# Patient Record
Sex: Female | Born: 1955 | Hispanic: No | Marital: Married | Smoking: Former smoker
Health system: Southern US, Community
[De-identification: ages and names within clinical notes are randomized; demographics above are authoritative.]

## PROBLEM LIST (undated history)

## (undated) DIAGNOSIS — I10 Essential (primary) hypertension: Secondary | ICD-10-CM

## (undated) DIAGNOSIS — J449 Chronic obstructive pulmonary disease, unspecified: Secondary | ICD-10-CM

---

## 2000-08-01 ENCOUNTER — Other Ambulatory Visit: Admission: RE | Admit: 2000-08-01 | Discharge: 2000-08-01 | Payer: Self-pay | Admitting: Gynecology

## 2010-07-23 ENCOUNTER — Encounter (INDEPENDENT_AMBULATORY_CARE_PROVIDER_SITE_OTHER): Payer: PRIVATE HEALTH INSURANCE

## 2010-07-23 DIAGNOSIS — R609 Edema, unspecified: Secondary | ICD-10-CM

## 2010-07-28 NOTE — Procedures (Unsigned)
DUPLEX DEEP VENOUS EXAM - LOWER EXTREMITY  INDICATION:  Edema.  HISTORY:  Edema:  Yes. Trauma/Surgery:  No. Pain:  Yes. PE:  No. Previous DVT:  No DVT, SVT in the 1980s. Anticoagulants:  No. Other:  DUPLEX EXAM:               CFV   SFV   PopV  PTV    GSV               R  L  R  L  R  L  R   L  R  L Thrombosis    o  o  o     o     o      o Spontaneous   +  +  +     +     +      + Phasic        +  +  +     +     +      + Augmentation  +  +  +     +     +      + Compressible  +  +  +     +     +      + Competent                 +     +      o  Legend:  + - yes  o - no  p - partial  D - decreased  IMPRESSION:  No evidence of deep venous thrombosis in the right lower extremity.  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  Results communicated to [facility] at Dr. Laurey Morale office on 07/23/2010.   _____________________________ V. Charlena Cross, MD  LT/MEDQ  D:  07/23/2010  T:  07/24/2010  Job:  [id number]

## 2014-03-13 ENCOUNTER — Ambulatory Visit: Payer: PRIVATE HEALTH INSURANCE | Admitting: Podiatry

## 2018-04-15 ENCOUNTER — Emergency Department (HOSPITAL_COMMUNITY): Payer: No Typology Code available for payment source

## 2018-04-15 ENCOUNTER — Encounter: Payer: Self-pay | Admitting: Emergency Medicine

## 2018-04-15 ENCOUNTER — Emergency Department (HOSPITAL_COMMUNITY)
Admission: EM | Admit: 2018-04-15 | Discharge: 2018-04-15 | Disposition: A | Discharge status: Home / Self Care | Payer: No Typology Code available for payment source | Attending: Emergency Medicine | Admitting: Emergency Medicine

## 2018-04-15 DIAGNOSIS — I10 Essential (primary) hypertension: Secondary | ICD-10-CM | POA: Diagnosis not present

## 2018-04-15 DIAGNOSIS — K5732 Diverticulitis of large intestine without perforation or abscess without bleeding: Secondary | ICD-10-CM

## 2018-04-15 DIAGNOSIS — K429 Umbilical hernia without obstruction or gangrene: Secondary | ICD-10-CM

## 2018-04-15 DIAGNOSIS — J449 Chronic obstructive pulmonary disease, unspecified: Secondary | ICD-10-CM | POA: Diagnosis not present

## 2018-04-15 DIAGNOSIS — Z87891 Personal history of nicotine dependence: Secondary | ICD-10-CM | POA: Insufficient documentation

## 2018-04-15 DIAGNOSIS — R109 Unspecified abdominal pain: Secondary | ICD-10-CM | POA: Diagnosis present

## 2018-04-15 HISTORY — DX: Chronic obstructive pulmonary disease, unspecified: J44.9

## 2018-04-15 HISTORY — DX: Essential (primary) hypertension: I10

## 2018-04-15 LAB — URINALYSIS, ROUTINE W REFLEX MICROSCOPIC
Bacteria, UA: NONE SEEN
Bilirubin Urine: NEGATIVE
Glucose, UA: NEGATIVE mg/dL
Hgb urine dipstick: NEGATIVE
Ketones, ur: NEGATIVE mg/dL
Leukocytes, UA: NEGATIVE
Nitrite: NEGATIVE
Protein, ur: 30 mg/dL — AB
Specific Gravity, Urine: 1.026 (ref 1.005–1.030)
pH: 5 (ref 5.0–8.0)

## 2018-04-15 LAB — COMPREHENSIVE METABOLIC PANEL
ALT: 13 U/L (ref 0–44)
AST: 15 U/L (ref 15–41)
Albumin: 3.1 g/dL — ABNORMAL LOW (ref 3.5–5.0)
Alkaline Phosphatase: 91 U/L (ref 38–126)
Anion gap: 10 (ref 5–15)
BUN: 12 mg/dL (ref 8–23)
CALCIUM: 8.8 mg/dL — AB (ref 8.9–10.3)
CO2: 30 mmol/L (ref 22–32)
Chloride: 100 mmol/L (ref 98–111)
Creatinine, Ser: 0.8 mg/dL (ref 0.44–1.00)
GFR calc Af Amer: >60 mL/min (ref >60)
GFR calc non Af Amer: >60 mL/min (ref >60)
Glucose, Bld: 125 mg/dL — ABNORMAL HIGH (ref 70–99)
Potassium: 4.3 mmol/L (ref 3.5–5.1)
Sodium: 140 mmol/L (ref 135–145)
Total Bilirubin: 1 mg/dL (ref 0.3–1.2)
Total Protein: 7.2 g/dL (ref 6.5–8.1)

## 2018-04-15 LAB — LACTIC ACID, PLASMA: Lactic Acid, Venous: 0.8 mmol/L (ref 0.5–1.9)

## 2018-04-15 LAB — CBC
HCT: 36.9 % (ref 36.0–46.0)
Hemoglobin: 11.1 g/dL — ABNORMAL LOW (ref 12.0–15.0)
MCH: 28.2 pg (ref 26.0–34.0)
MCHC: 30.1 g/dL (ref 30.0–36.0)
MCV: 93.9 fL (ref 80.0–100.0)
Platelets: 292 10*3/uL (ref 150–400)
RBC: 3.93 MIL/uL (ref 3.87–5.11)
RDW: 15.6 % — ABNORMAL HIGH (ref 11.5–15.5)
WBC: 17.5 10*3/uL — ABNORMAL HIGH (ref 4.0–10.5)
nRBC: 0 % (ref 0.0–0.2)

## 2018-04-15 LAB — LIPASE, BLOOD: Lipase: 24 U/L (ref 11–51)

## 2018-04-15 MED ORDER — CIPROFLOXACIN HCL 500 MG PO TABS: 500.0000 mg | ORAL_TABLET | Freq: Two times a day (BID) | ORAL | 0 refills | Status: AC

## 2018-04-15 MED ORDER — SODIUM CHLORIDE (PF) 0.9 % IJ SOLN
INTRAMUSCULAR | Status: AC
  Filled 2018-04-15: qty 50

## 2018-04-15 MED ORDER — METRONIDAZOLE IN NACL 5-0.79 MG/ML-% IV SOLN
500.0000 mg | Freq: Once | INTRAVENOUS | Status: AC
  Administered 2018-04-15: 500 mg via INTRAVENOUS
  Filled 2018-04-15: qty 100

## 2018-04-15 MED ORDER — SODIUM CHLORIDE 0.9 % IV BOLUS
1000.0000 mL | Freq: Once | INTRAVENOUS | Status: AC
  Administered 2018-04-15: 1000 mL via INTRAVENOUS

## 2018-04-15 MED ORDER — METRONIDAZOLE 500 MG PO TABS: 500.0000 mg | ORAL_TABLET | Freq: Two times a day (BID) | ORAL | 0 refills | Status: AC

## 2018-04-15 MED ORDER — SODIUM CHLORIDE 0.9% FLUSH: 3.0000 mL | Freq: Once | INTRAVENOUS | Status: DC

## 2018-04-15 MED ORDER — IOPAMIDOL (ISOVUE-300) INJECTION 61%
INTRAVENOUS | Status: AC
  Administered 2018-04-15: 125 mL
  Filled 2018-04-15: qty 150

## 2018-04-15 MED ORDER — CIPROFLOXACIN IN D5W 400 MG/200ML IV SOLN
400.0000 mg | Freq: Once | INTRAVENOUS | Status: AC
  Administered 2018-04-15: 400 mg via INTRAVENOUS
  Filled 2018-04-15: qty 200

## 2018-04-15 NOTE — ED Notes (Signed)
Blood draw attempted but was unsuccessful 

## 2018-04-15 NOTE — ED Notes (Signed)
Pt has been recommended O2 in the past, prior to quitting smoking. Does not currently use O2 at home.

## 2018-04-15 NOTE — ED Notes (Signed)
PATIENT PLACE ON 2 LITTERS OF OXYGEN THERAPY O2 STATS ROSE TO 94%

## 2018-04-15 NOTE — ED Provider Notes (Signed)
[facility]-EMERGENCY DEPT Provider Note   CSN: [id number] Arrival date & time: 04/15/18  1207     History   Chief Complaint Chief Complaint  Patient presents with  . Abdominal Pain    HPI Yvonne Gutierrez is a 63 y.o. female.  The history is provided by the patient. No language interpreter was used.  Abdominal Pain     63 year old female with history of prior diverticular disease presenting for evaluation of abdominal pain.  Patient report for the past 2 weeks she has had progressive worsening abdominal pain.  Pain is primarily to her lower abdomen, sharp, achy, persistent with associated urinary discomfort.  Pain is 10 out of 10.  She endorsed and diaphoretic without fever.  She does not complain of any nausea vomiting or diarrhea.  She felt pain is similar to diverticulitis that she had several years past.  She also mention been seen by her PCP for her condition initially and was given antibiotic for suspected UTI.  She took it for 10 days without any relief.  She has not had a colonoscopy.  She does not complaining of chest pain or shortness of breath, constipation or diarrhea.  Past Medical History:  Diagnosis Date  . COPD (chronic obstructive pulmonary disease) (HCC)   . Hypertension     There are no active problems to display for this patient.   History reviewed. No pertinent surgical history.   OB History   No obstetric history on file.      Home Medications    Prior to Admission medications   Not on File    Family History No family history on file.  Social History Social History   Tobacco Use  . Smoking status: Former Smoker    Last attempt to quit: 04/15/2010    Years since quitting: 8.0  . Smokeless tobacco: Never Used  Substance Use Topics  . Alcohol use: Never    Frequency: Never  . Drug use: Never     Allergies   Patient has no known allergies.   Review of Systems Review of Systems  Gastrointestinal: Positive for  abdominal pain.  All other systems reviewed and are negative.    Physical Exam Updated Vital Signs BP (!) 120/58 (BP Location: Left Arm)   Pulse (!) 101   Temp 98.2 F (36.8 C) (Oral)   Resp (!) 25   Ht 5\' 8"  (1.727 m)   Wt (!) 163.3 kg   SpO2 98%   BMI 54.74 kg/m   Physical Exam Vitals signs and nursing note reviewed.  Constitutional:      General: She is not in acute distress.    Appearance: She is well-developed. She is obese.  HENT:     Head: Atraumatic.  Eyes:     Conjunctiva/sclera: Conjunctivae normal.  Neck:     Musculoskeletal: Neck supple.  Cardiovascular:     Rate and Rhythm: Tachycardia present.  Pulmonary:     Effort: Pulmonary effort is normal.     Breath sounds: Normal breath sounds.     Comments: Distant breath sounds without wheezes rales or rhonchi. Abdominal:     Palpations: Abdomen is rigid.     Tenderness: There is generalized abdominal tenderness and tenderness in the suprapubic area.     Hernia: No hernia is present.  Skin:    Findings: No rash.  Neurological:     Mental Status: She is alert.      ED Treatments / Results  Labs (all labs ordered  are listed, but only abnormal results are displayed) Labs Reviewed  COMPREHENSIVE METABOLIC PANEL - Abnormal; Notable for the following components:      Result Value   Glucose, Bld 125 (*)    Calcium 8.8 (*)    Albumin 3.1 (*)    All other components within normal limits  CBC - Abnormal; Notable for the following components:   WBC 17.5 (*)    Hemoglobin 11.1 (*)    RDW 15.6 (*)    All other components within normal limits  URINALYSIS, ROUTINE W REFLEX MICROSCOPIC - Abnormal; Notable for the following components:   Color, Urine AMBER (*)    APPearance HAZY (*)    Protein, ur 30 (*)    All other components within normal limits  URINE CULTURE  LIPASE, BLOOD  LACTIC ACID, PLASMA    EKG None  Radiology Ct Abdomen Pelvis W Contrast  Result Date: 04/15/2018 CLINICAL DATA:  Lower  abdominal pain for the past 2 weeks. Fever. Night sweats. Clinical concern for diverticulitis. EXAM: CT ABDOMEN AND PELVIS WITH CONTRAST TECHNIQUE: Multidetector CT imaging of the abdomen and pelvis was performed using the standard protocol following bolus administration of intravenous contrast. CONTRAST:  ISOVUE-300 IOPAMIDOL (ISOVUE-300) INJECTION 61% COMPARISON:  None. FINDINGS: Lower chest: Small amount of linear atelectasis or scarring at both lung bases. Mildly enlarged heart. Hepatobiliary: Small right lobe liver cyst. Normal appearing gallbladder. Pancreas: Unremarkable. No pancreatic ductal dilatation or surrounding inflammatory changes. Spleen: Normal in size without focal abnormality. Adrenals/Urinary Tract: Minimal urine in the urinary bladder. No gross bladder abnormality seen. Normal appearing adrenal glands, kidneys and ureters. Stomach/Bowel: Multiple sigmoid and descending colon diverticula. Diffuse wall thickening and pericolonic soft tissue stranding involving the mid and distal sigmoid colon with no well-defined fluid collection and no free peritoneal air. Normal appearing stomach, small bowel and appendix. Vascular/Lymphatic: Atheromatous arterial calcifications without aneurysm. No enlarged lymph nodes. Reproductive: Uterus and bilateral adnexa are unremarkable. Other: Very small umbilical hernia containing fat with soft tissue stranding in the adjacent subcutaneous fat. No abdominopelvic ascites. Musculoskeletal: Lumbar and lower thoracic spine degenerative changes. IMPRESSION: 1. Long segment of sigmoid colon diverticulitis without abscess. 2. Extensive sigmoid and descending colon diverticulosis. 3. Small umbilical hernia containing fat with soft tissue stranding in the adjacent subcutaneous fat, suggesting inflammatory changes associated with the hernia. Electronically Signed   By: Beckie Salts M.D.   On: 04/15/2018 18:19    Procedures Procedures (including critical care  time)  Medications Ordered in ED Medications  sodium chloride (PF) 0.9 % injection (has no administration in time range)  ciprofloxacin (CIPRO) IVPB 400 mg (400 mg Intravenous New Bag/Given 04/15/18 1928)  metroNIDAZOLE (FLAGYL) IVPB 500 mg (500 mg Intravenous New Bag/Given 04/15/18 1930)  sodium chloride 0.9 % bolus 1,000 mL (0 mLs Intravenous Stopped 04/15/18 1803)  iopamidol (ISOVUE-300) 61 % injection (125 mLs  Contrast Given 04/15/18 1742)     Initial Impression / Assessment and Plan / ED Course  I have reviewed the triage vital signs and the nursing notes.  Pertinent labs & imaging results that were available during my care of the patient were reviewed by me and considered in my medical decision making (see chart for details).     BP 139/72   Pulse (!) 101   Temp 98.2 F (36.8 C) (Oral)   Resp 20   Ht 5\' 8"  (1.727 m)   Wt (!) 163.3 kg   SpO2 97%   BMI 54.74 kg/m    Final  Clinical Impressions(s) / ED Diagnoses   Final diagnoses:  Diverticulitis large intestine w/o perforation or abscess w/o bleeding    ED Discharge Orders         Ordered    ciprofloxacin (CIPRO) 500 MG tablet  2 times daily     04/15/18 2013    metroNIDAZOLE (FLAGYL) 500 MG tablet  2 times daily     04/15/18 2013         4:35 PM Patient here with dysuria and lower abdominal pain.  States she was treated for UTI with antibiotic for 10 days without any improvement.  History of diverticulitis in the past.  Patient was found to have a white blood cells of 17.  She would benefit from an abdominal pelvis CT scan for further evaluation.  Initially her O2 sats was 88 however she does not complain of any shortness of breath at that time.  She is not O2 dependent.  I offered pain medication but patient declined at this time.  8:14 PM Normal lactic acid, normal lipase, electrolyte panels are reassuring, elevated white count of 17.5, urine without signs of urinary tract infection, abdominal pelvis CT scan  demonstrate long segment of sigmoid colon diverticulitis without abscess.  Extensive sigmoid and descending colon diverticulosis.  Small umbilical hernia containing fat and soft tissue stranding in the adjacent subcutaneous fat suggestive of inflammatory changes associated with hernia.  At this time, patient's pain is primarily to her lower abdomen and not so much in her umbilical region.  No obvious signs to suggest hernia incarceration or strangulation.  Patient was given Cipro and Flagyl as treatment of her symptom.  Patient recommended to follow-up with her PCP for further care.  Return precaution discussed.   Fayrene Helperran, Vincy Feliz, PA-C 04/15/18 2016    Benjiman CorePickering, Nathan, MD 04/16/18 [id number]

## 2018-04-15 NOTE — ED Triage Notes (Signed)
Pt states lower abdominal pain, more so on the right side. Pt states she broke out in a sweat after a BM yesterday. Pt has hx of diverticultis. Pain started 2 weeks ago. Pt's abdomen is more rigid than normal.  Pt states pain is worse with voiding.

## 2018-04-18 LAB — URINE CULTURE: Culture: >=100000 — AB

## 2018-04-19 ENCOUNTER — Telehealth: Payer: Self-pay

## 2018-04-19 NOTE — Telephone Encounter (Signed)
Post ED Visit - Positive Culture Follow-up  Culture report reviewed by antimicrobial stewardship pharmacist:  []  Enzo Bi, Pharm.D. []  Celedonio Miyamoto, Pharm.D., BCPS AQ-ID []  Garvin Fila, Pharm.D., BCPS []  Georgina Pillion, Pharm.D., BCPS []  [place], [address].D., BCPS, AAHIVP []  Estella Husk, Pharm.D., BCPS, AAHIVP [x]  Lysle Pearl, PharmD, BCPS []  Phillips Climes, PharmD, BCPS []  Agapito Games, PharmD, BCPS []  Verlan Friends, PharmD  Positive urine culture Treated with Cipro, organism sensitive to the same and no further patient follow-up is required at this time.  Jerry Caras 04/19/2018, 9:04 AM

## 2019-06-13 IMAGING — CT CT ABD-PELV W/ CM
3 of 6 series · 16 of 46 positions shown, 18 images · IV contrast (ISOVUE)
Comparison: None.

CLINICAL DATA: Lower abdominal pain for the past 2 weeks. Fever.
Night sweats. Clinical concern for diverticulitis.

EXAM:
CT ABDOMEN AND PELVIS WITH CONTRAST
TECHNIQUE: Multidetector CT imaging of the abdomen and pelvis was performed
using the standard protocol following bolus administration of
intravenous contrast.
CONTRAST:  125mL [id number] IOPAMIDOL ([id number]) INJECTION 61%

[Series 2: axial st · axial · 0.88mm/px · z∈[-512,-352]mm · 4 of 96 slices shown (1 of 2)]
[im 11/96  soft-tissue]
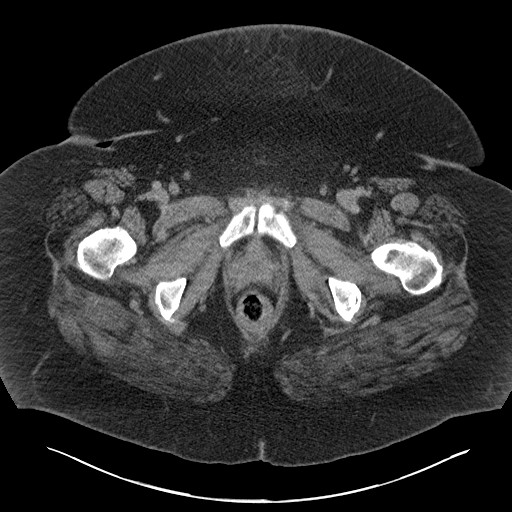
[im 22/96  soft-tissue]
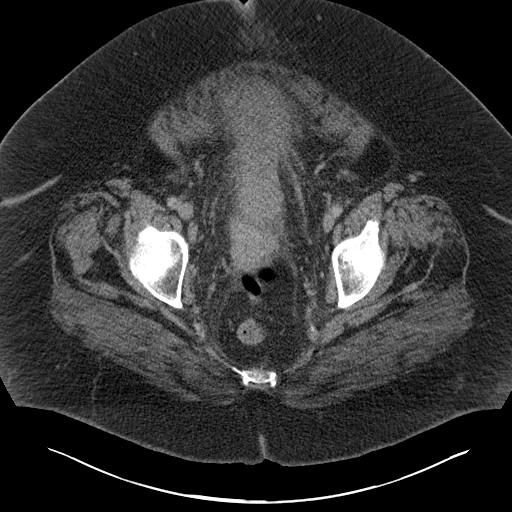
[im 32/96  soft-tissue]
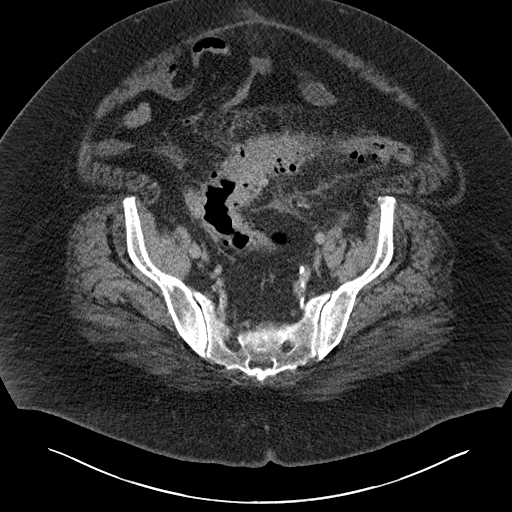
[im 43/96  soft-tissue]
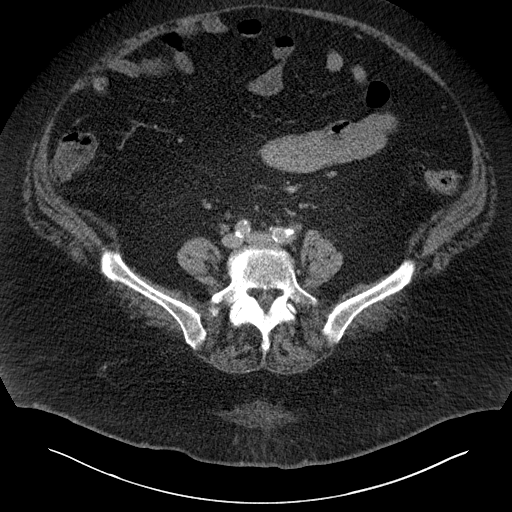

[Series 5: coronal st · coronal · 0.96mm/px · 3 of 148 slices shown]
[im 50/148  soft-tissue]
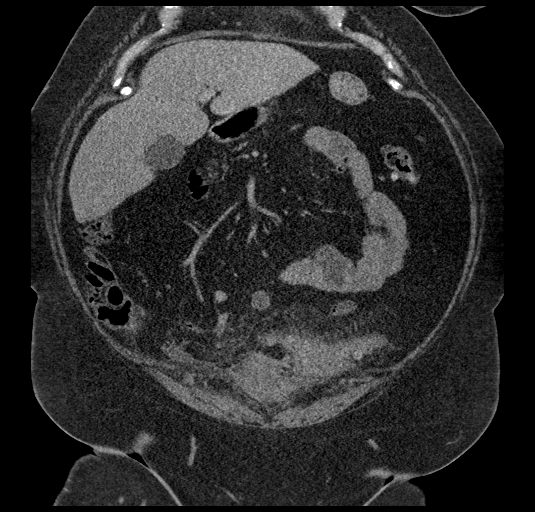
[im 66/148  soft-tissue]
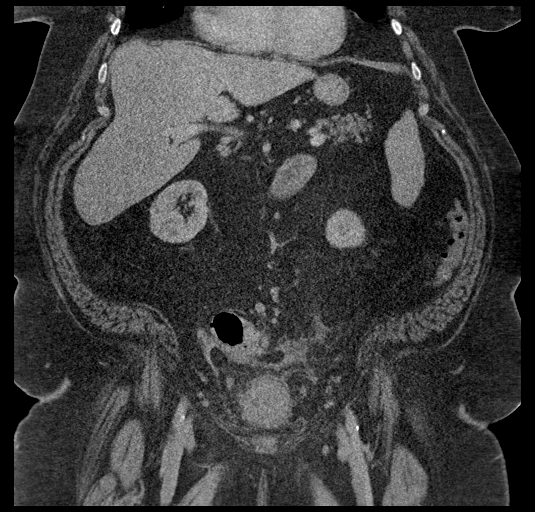
[im 82/148  soft-tissue]
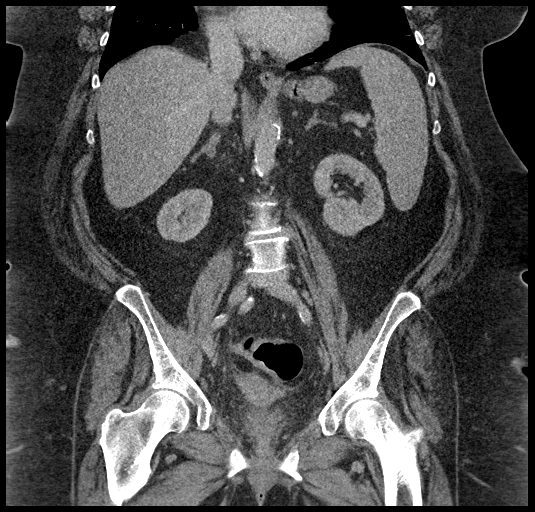

[Series 8: axial st · axial · 0.98mm/px · z∈[-518,-138]mm · 9 of 96 slices shown, 11 images (2 of 2)]
[im 10/96  soft-tissue]
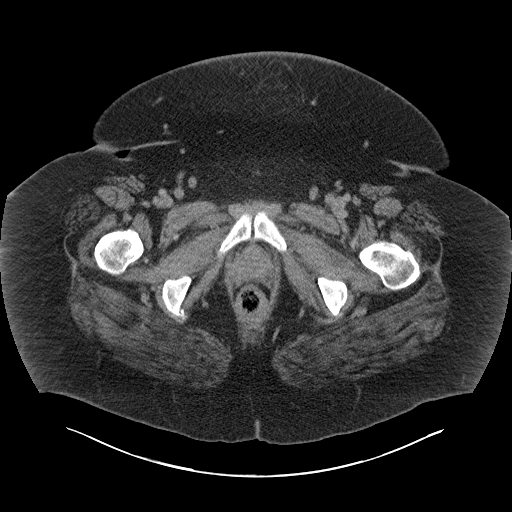
[im 10/96  bone]
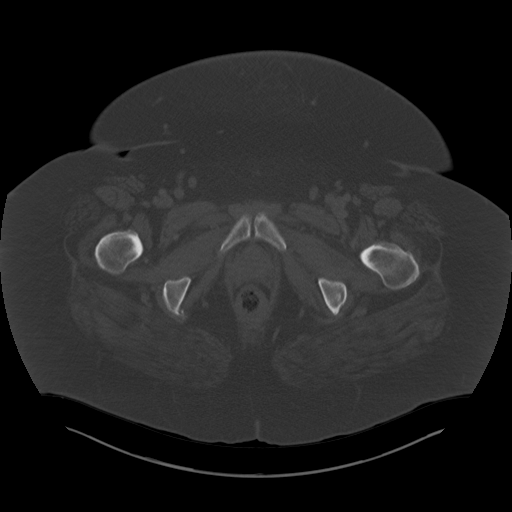
[im 20/96  soft-tissue]
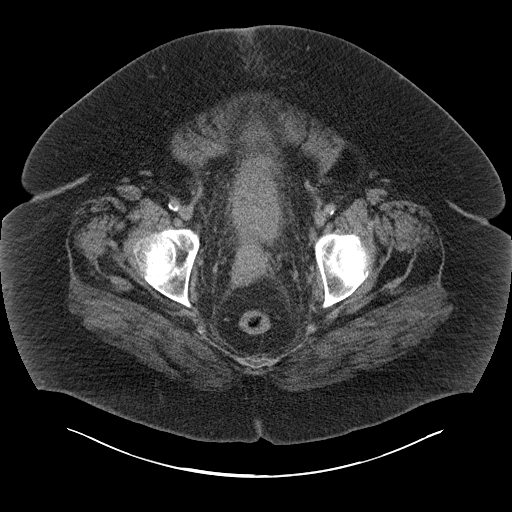
[im 29/96  soft-tissue]
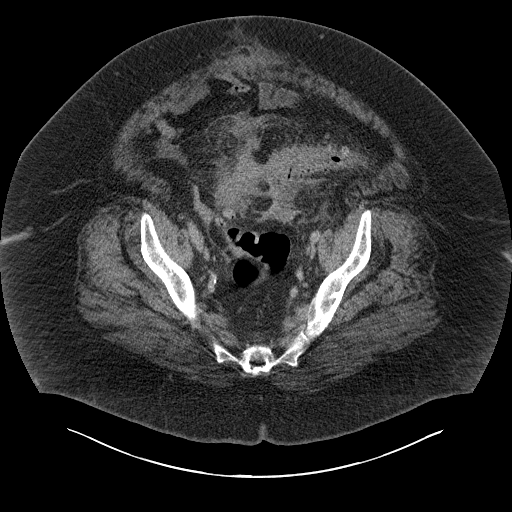
[im 39/96  soft-tissue]
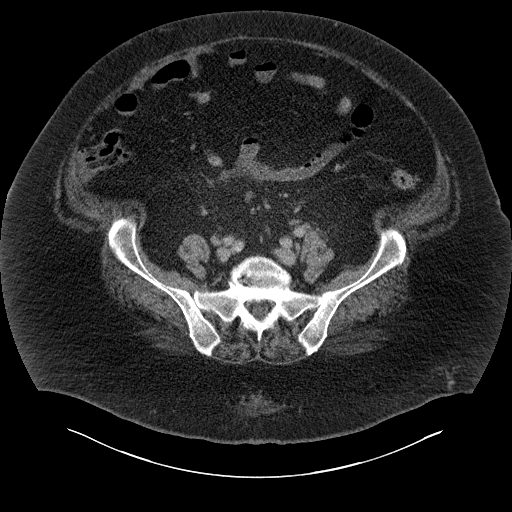
[im 48/96  soft-tissue]
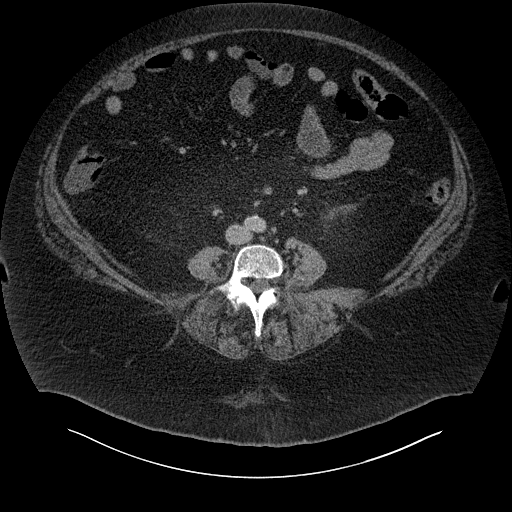
[im 58/96  soft-tissue]
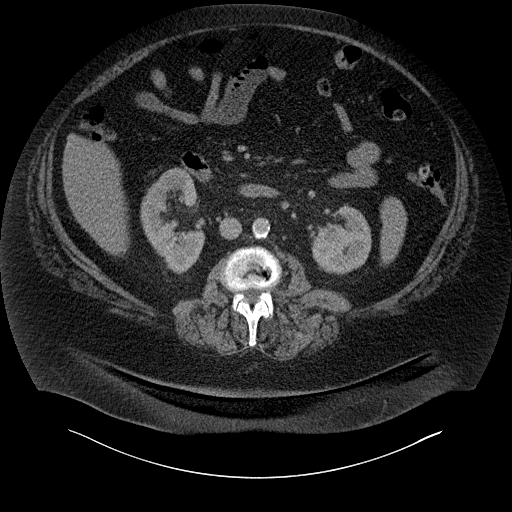
[im 67/96  soft-tissue]
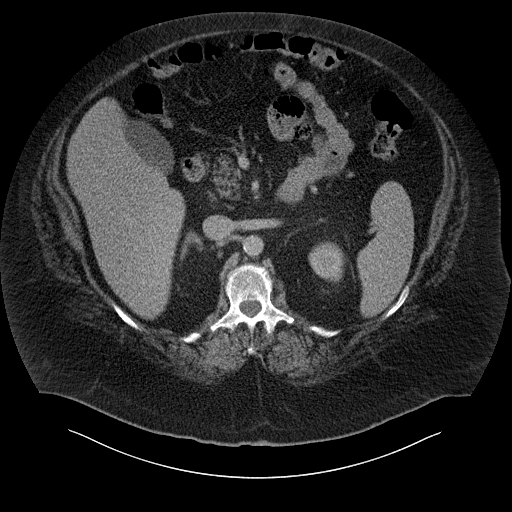
[im 77/96  soft-tissue]
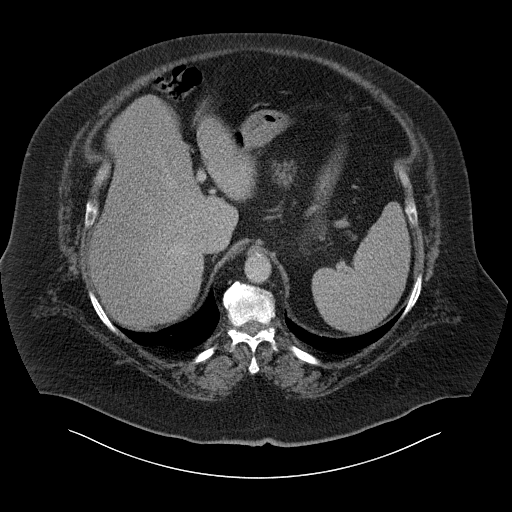
[im 86/96  soft-tissue]
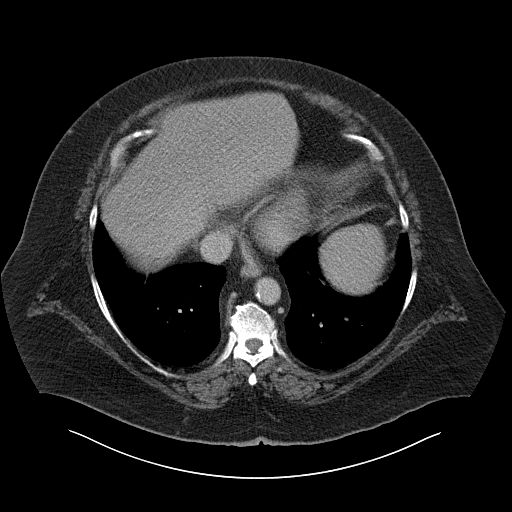
[im 86/96  bone]
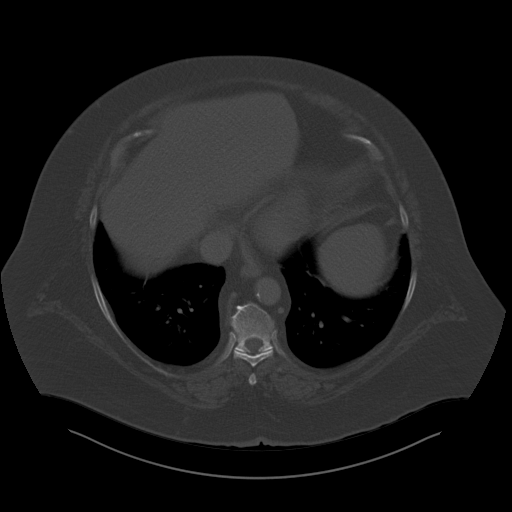

[16 of 46 positions shown; findings below may reference images not displayed]

FINDINGS: Lower chest: Small amount of linear atelectasis or scarring at both
lung bases. Mildly enlarged heart.

Hepatobiliary: Small right lobe liver cyst. Normal appearing
gallbladder.

Pancreas: Unremarkable. No pancreatic ductal dilatation or
surrounding inflammatory changes.

Spleen: Normal in size without focal abnormality.

Adrenals/Urinary Tract: Minimal urine in the urinary bladder. No
gross bladder abnormality seen. Normal appearing adrenal glands,
kidneys and ureters.

Stomach/Bowel: Multiple sigmoid and descending colon diverticula.
Diffuse wall thickening and pericolonic soft tissue stranding
involving the mid and distal sigmoid colon with no well-defined
fluid collection and no free peritoneal air. Normal appearing
stomach, small bowel and appendix.

Vascular/Lymphatic: Atheromatous arterial calcifications without
aneurysm. No enlarged lymph nodes.

Reproductive: Uterus and bilateral adnexa are unremarkable.

Other: Very small umbilical hernia containing fat with soft tissue
stranding in the adjacent subcutaneous fat.. No abdominopelvic
ascites.

Musculoskeletal: Lumbar and lower thoracic spine degenerative
changes.
IMPRESSION: 1. Long segment of sigmoid colon diverticulitis without abscess.
2. Extensive sigmoid and descending colon diverticulosis.
3. Small umbilical hernia containing fat with soft tissue stranding
in the adjacent subcutaneous fat, suggesting inflammatory changes
associated with the hernia.

## 2020-01-21 DEATH — deceased
